# Patient Record
Sex: Female | Born: 2000
Health system: Southern US, Community
[De-identification: ages and names within clinical notes are randomized; demographics above are authoritative.]

## PROBLEM LIST (undated history)

## (undated) DIAGNOSIS — F419 Anxiety disorder, unspecified: Secondary | ICD-10-CM

## (undated) DIAGNOSIS — T7840XA Allergy, unspecified, initial encounter: Secondary | ICD-10-CM

## (undated) HISTORY — DX: Anxiety disorder, unspecified: F41.9

## (undated) HISTORY — DX: Allergy, unspecified, initial encounter: T78.40XA

---
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
## 2020-04-10 ENCOUNTER — Other Ambulatory Visit: Payer: Self-pay | Admitting: Family Medicine

## 2020-04-10 DIAGNOSIS — F411 Generalized anxiety disorder: Secondary | ICD-10-CM

## 2020-04-10 NOTE — Telephone Encounter (Signed)
[name]. NTBS LOV 06/14/19 mail order not sent

## 2020-05-06 ENCOUNTER — Telehealth: Payer: Self-pay | Admitting: Family Medicine

## 2020-05-06 NOTE — Telephone Encounter (Signed)
Yes I am fine to go ahead and write a letter saying that she can have an emotional support animal

## 2020-05-07 NOTE — Telephone Encounter (Signed)
Patient states that was all letter needed to say.  Letter has been completed and placed up front- patient aware.

## 2020-05-07 NOTE — Telephone Encounter (Signed)
LMTCB- letter has not been done yet.  What does letter need to have in it? Just that she needs a emotional support animal ??

## 2020-07-19 ENCOUNTER — Telehealth: Payer: Self-pay | Admitting: Family Medicine

## 2020-07-19 ENCOUNTER — Other Ambulatory Visit: Payer: Self-pay

## 2020-07-19 DIAGNOSIS — Z0000 Encounter for general adult medical examination without abnormal findings: Secondary | ICD-10-CM

## 2020-07-19 NOTE — Telephone Encounter (Signed)
Spoke with dad. Appt for televisit made for Tuesday at 3pm. Patient will stop by Monday to leave blood and to pick up letter for college.

## 2020-07-19 NOTE — Telephone Encounter (Signed)
Pts dad called stating that pt just informed him that she was almost out of her Prozac Rx and she is getting ready to leave for college on 07/23/20. Wants to know if there is anyway Dr [name] can work her in on Monday 07/22/20 or if another provider can see her or if pt can be scheduled as a televisit and just come in before she leaves for college to give urine sample/blood work if needed.

## 2020-07-23 ENCOUNTER — Telehealth: Payer: Self-pay | Admitting: Family Medicine

## 2020-07-23 ENCOUNTER — Encounter: Payer: Self-pay | Admitting: Family Medicine

## 2020-07-23 ENCOUNTER — Encounter: Payer: Commercial Managed Care - PPO | Admitting: Family Medicine

## 2020-07-23 NOTE — Progress Notes (Signed)
Unable to reach patient after multiple attempts

## 2020-07-23 NOTE — Telephone Encounter (Signed)
It looks like there was a phone message from May of this year that said from [place] that the note was completed and placed upfront then, I do not know for sure about that but you could always reprint it, thanks

## 2020-07-24 ENCOUNTER — Ambulatory Visit: Payer: Commercial Managed Care - PPO | Admitting: Family Medicine

## 2020-07-24 NOTE — Telephone Encounter (Signed)
Spoke with patient, she said she has already picked up the letter.

## 2021-05-15 HISTORY — PX: IR OUTSIDE FILMS BODY: IMG5291

## 2021-06-25 ENCOUNTER — Other Ambulatory Visit (HOSPITAL_COMMUNITY): Payer: Self-pay | Admitting: Physician Assistant

## 2021-06-25 DIAGNOSIS — R1084 Generalized abdominal pain: Secondary | ICD-10-CM

## 2021-06-25 DIAGNOSIS — R112 Nausea with vomiting, unspecified: Secondary | ICD-10-CM

## 2021-06-27 ENCOUNTER — Encounter (HOSPITAL_COMMUNITY): Payer: Self-pay

## 2021-06-27 ENCOUNTER — Encounter (HOSPITAL_COMMUNITY)
Admission: RE | Admit: 2021-06-27 | Discharge: 2021-06-27 | Disposition: A | Discharge status: Home / Self Care | Payer: Commercial Managed Care - PPO | Source: Ambulatory Visit | Attending: Physician Assistant | Admitting: Physician Assistant

## 2021-06-27 ENCOUNTER — Other Ambulatory Visit: Payer: Self-pay

## 2021-06-27 DIAGNOSIS — R112 Nausea with vomiting, unspecified: Secondary | ICD-10-CM | POA: Diagnosis present

## 2021-06-27 DIAGNOSIS — R1084 Generalized abdominal pain: Secondary | ICD-10-CM | POA: Diagnosis not present

## 2021-06-27 MED ORDER — TECHNETIUM TC 99M MEBROFENIN IV KIT
5.0000 | PACK | Freq: Once | INTRAVENOUS | Status: AC | PRN
  Administered 2021-06-27: 5.5 via INTRAVENOUS

## 2021-06-27 MED ORDER — SINCALIDE 5 MCG IJ SOLR
INTRAMUSCULAR | Status: AC
  Administered 2021-06-27: 1.11 ug via INTRAVENOUS
  Filled 2021-06-27: qty 5

## 2021-06-27 MED ORDER — SODIUM CHLORIDE FLUSH 0.9 % IV SOLN
INTRAVENOUS | Status: AC
  Filled 2021-06-27: qty 20

## 2021-06-27 MED ORDER — STERILE WATER FOR INJECTION IJ SOLN
INTRAMUSCULAR | Status: AC
  Administered 2021-06-27: 1.11 mL via INTRAVENOUS
  Filled 2021-06-27: qty 10

## 2021-07-04 ENCOUNTER — Telehealth: Payer: Self-pay | Admitting: Family Medicine

## 2021-07-04 NOTE — Telephone Encounter (Signed)
A letter has been created and placed up front for pt.  Per [name], please have pt schedule an appointment for a check up on her health. It can be a next available appointment.

## 2021-07-04 NOTE — Telephone Encounter (Signed)
Pt needs an updated letter to have pet for emotional support.  Updated date for this year and needs to have [name]'s signature. This is for apartment. Please call back when ready

## 2021-07-04 NOTE — Telephone Encounter (Signed)
Patient has not been seen in almost 2 years, I am okay with doing the letter for her but she needs to get an appointment on file so she can come in for a physical or at least an evaluation for her emotional health.

## 2021-07-04 NOTE — Telephone Encounter (Signed)
Okay for letter

## 2021-07-08 ENCOUNTER — Encounter: Payer: Self-pay | Admitting: Family Medicine

## 2021-07-08 ENCOUNTER — Other Ambulatory Visit: Payer: Self-pay

## 2021-07-08 ENCOUNTER — Ambulatory Visit (INDEPENDENT_AMBULATORY_CARE_PROVIDER_SITE_OTHER): Payer: Commercial Managed Care - PPO | Admitting: Family Medicine

## 2021-07-08 VITALS — BP 113/81 | HR 98 | Ht 62.0 in | Wt 119.0 lb

## 2021-07-08 DIAGNOSIS — R197 Diarrhea, unspecified: Secondary | ICD-10-CM

## 2021-07-08 DIAGNOSIS — F419 Anxiety disorder, unspecified: Secondary | ICD-10-CM

## 2021-07-08 DIAGNOSIS — R634 Abnormal weight loss: Secondary | ICD-10-CM

## 2021-07-08 DIAGNOSIS — K5909 Other constipation: Secondary | ICD-10-CM | POA: Diagnosis not present

## 2021-07-08 DIAGNOSIS — R1032 Left lower quadrant pain: Secondary | ICD-10-CM

## 2021-07-08 MED ORDER — AMITRIPTYLINE HCL 50 MG PO TABS: 50.0000 mg | ORAL_TABLET | Freq: Every day | ORAL | 1 refills | Status: DC

## 2021-07-08 NOTE — Progress Notes (Signed)
BP 113/81   Pulse 98   Ht '5\' 2"'  (1.575 m)   Wt 119 lb (54 kg)   SpO2 97%   BMI 21.77 kg/m    Subjective:   Patient ID: Julie Vasquez, female    DOB: [birth year], 20 y.o.   MRN: [id number]  HPI: Raynie Vasquez is a 20 y.o. female presenting on 07/08/2021 for Abdominal Pain (LLQ- started in April), Nausea, and Emesis   HPI Patient has been fighting gradually worsening abdominal pain and nausea that is been going on over the past 3 or 4 months.  She says it started up at school and she is started having some nausea and vomiting intermittently and then lower abdominal pain and she did go see a gastroenterologist up there and had some scans and some images and then she has also had a HIDA scan here.  She had a CT abdomen and an EGD and is now on Dexilant and has also tried famotidine and omeprazole and sucralfate and Zofran and dicyclomine.  She has gotten to where over the past couple months she has lost about 10 pounds of weight because she is not keeping her food down.  She denies any blood in her stool or blood in her vomit.  She does complain of some pain with intercourse but that is not the same pain that she experiences.  The left lower quadrant abdominal pain is constant but when she does eat it spreads to her whole abdomen.  She did have celiac testing and also did a trial of going off milk gluten and eggs for 3 weeks and it made no difference.  She does feel like it is worsening to where she cannot even function because of it now.  Relevant past medical, surgical, family and social history reviewed and updated as indicated. Interim medical history since our last visit reviewed. Allergies and medications reviewed and updated.  Review of Systems  Constitutional:  Negative for chills and fever.  Eyes:  Negative for visual disturbance.  Respiratory:  Negative for chest tightness and shortness of breath.   Cardiovascular:  Negative for chest pain and leg swelling.  Gastrointestinal:  Positive for  abdominal pain, constipation, diarrhea, nausea and vomiting.  Musculoskeletal:  Negative for back pain and gait problem.  Skin:  Negative for rash.  Neurological:  Negative for light-headedness and headaches.  Psychiatric/Behavioral:  Negative for agitation and behavioral problems.   All other systems reviewed and are negative.  Per HPI unless specifically indicated above   Allergies as of 07/08/2021       Reactions   Other Itching   Peanuts        Medication List        Accurate as of July 08, 2021 11:27 AM. If you have any questions, ask your nurse or doctor.          STOP taking these medications    FLUoxetine 40 MG capsule Commonly known as: PROZAC Stopped by: Worthy Rancher, MD   ibuprofen 600 MG tablet Commonly known as: ADVIL Stopped by: Fransisca Kaufmann Kirstie Larsen, MD   montelukast 10 MG tablet Commonly known as: SINGULAIR Stopped by: Fransisca Kaufmann Tyreque Finken, MD       TAKE these medications    amitriptyline 50 MG tablet Commonly known as: ELAVIL Take 1 tablet (50 mg total) by mouth at bedtime. Started by: Fransisca Kaufmann Shavona Gunderman, MD   azelastine 0.1 % nasal spray Commonly known as: ASTELIN Place 2 sprays into both nostrils 2 (two)  times daily. Use in each nostril as directed   cetirizine 10 MG tablet Commonly known as: ZYRTEC Take 10 mg by mouth daily.   norethindrone-ethinyl estradiol-iron 1.5-30 MG-MCG tablet Commonly known as: LOESTRIN FE Take 1 tablet by mouth daily.         Objective:   BP 113/81   Pulse 98   Ht '5\' 2"'  (1.575 m)   Wt 119 lb (54 kg)   SpO2 97%   BMI 21.77 kg/m   Wt Readings from Last 3 Encounters:  07/08/21 119 lb (54 kg)  06/14/19 111 lb 6.4 oz (50.5 kg) (22 %, Z= -0.78)*  10/14/17 93 lb 12.8 oz (42.5 kg) (2 %, Z= -1.97)*   * Growth percentiles are based on CDC (Girls, 2-20 Years) data.    Physical Exam Vitals and nursing note reviewed.  Constitutional:      General: She is not in acute distress.    Appearance: She  is well-developed. She is not diaphoretic.  Eyes:     Conjunctiva/sclera: Conjunctivae normal.  Cardiovascular:     Rate and Rhythm: Normal rate and regular rhythm.     Heart sounds: Normal heart sounds. No murmur heard. Pulmonary:     Effort: Pulmonary effort is normal. No respiratory distress.     Breath sounds: Normal breath sounds. No wheezing.  Abdominal:     General: Abdomen is flat. Bowel sounds are normal. There is no distension.     Tenderness: There is abdominal tenderness. There is no right CVA tenderness, left CVA tenderness, guarding or rebound.  Skin:    General: Skin is warm and dry.     Findings: No rash.  Neurological:     Mental Status: She is alert and oriented to person, place, and time.     Coordination: Coordination normal.  Psychiatric:        Behavior: Behavior normal.      Assessment & Plan:   Problem List Items Addressed This Visit   None Visit Diagnoses     Left lower quadrant abdominal pain    -  Primary   Relevant Medications   amitriptyline (ELAVIL) 50 MG tablet   Other Relevant Orders   Food Allergy Profile   Alpha-Gal Panel   BMP8+EGFR   Cdiff NAA+O+P+Stool Culture   Ambulatory referral to Gastroenterology   Intermittent diarrhea       Relevant Medications   amitriptyline (ELAVIL) 50 MG tablet   Other Relevant Orders   Food Allergy Profile   Alpha-Gal Panel   BMP8+EGFR   Cdiff NAA+O+P+Stool Culture   Ambulatory referral to Gastroenterology   Intermittent constipation       Relevant Medications   amitriptyline (ELAVIL) 50 MG tablet   Other Relevant Orders   Food Allergy Profile   Alpha-Gal Panel   BMP8+EGFR   Cdiff NAA+O+P+Stool Culture   Ambulatory referral to Gastroenterology   Recent unexplained weight loss       Relevant Medications   amitriptyline (ELAVIL) 50 MG tablet   Other Relevant Orders   Food Allergy Profile   Alpha-Gal Panel   BMP8+EGFR   Cdiff NAA+O+P+Stool Culture   Ambulatory referral to Gastroenterology    Anxiety       Relevant Medications   amitriptyline (ELAVIL) 50 MG tablet     Will try patient on amitriptyline, will have her do a stool study panel and some food allergy panels and will test for red meat allergy.    Follow up plan: Return if symptoms worsen or fail  to improve, for 2 to 4-week follow-up.  Counseling provided for all of the vaccine components Orders Placed This Encounter  Procedures   Cdiff NAA+O+P+Stool Culture   Food Allergy Profile   Alpha-Gal Panel   BMP8+EGFR   Ambulatory referral to Gastroenterology    Caryl Pina, MD [facility] 07/08/2021, 11:27 AM

## 2021-07-11 LAB — BMP8+EGFR
BUN/Creatinine Ratio: 12 (ref 9–23)
BUN: 9 mg/dL (ref 6–20)
CO2: 21 mmol/L (ref 20–29)
Calcium: 9.8 mg/dL (ref 8.7–10.2)
Chloride: 104 mmol/L (ref 96–106)
Creatinine, Ser: 0.77 mg/dL (ref 0.57–1.00)
Glucose: 83 mg/dL (ref 65–99)
Potassium: 4.1 mmol/L (ref 3.5–5.2)
Sodium: 139 mmol/L (ref 134–144)
eGFR: 113 mL/min/{1.73_m2} (ref >59)

## 2021-07-11 LAB — FOOD ALLERGY PROFILE
Allergen Corn, IgE: 0.25 kU/L — AB
Clam IgE: 1.01 kU/L — AB
Codfish IgE: <0.1 kU/L
Egg White IgE: 0.54 kU/L — AB
Milk IgE: 2.82 kU/L — AB
Peanut IgE: 0.13 kU/L — AB
Scallop IgE: 0.47 kU/L — AB
Sesame Seed IgE: 0.18 kU/L — AB
Shrimp IgE: 1.5 kU/L — AB
Soybean IgE: <0.1 kU/L
Walnut IgE: <0.1 kU/L
Wheat IgE: 0.53 kU/L — AB

## 2021-07-11 LAB — ALPHA-GAL PANEL
Allergen Lamb IgE: 0.27 kU/L — AB
Beef IgE: 0.39 kU/L — AB
IgE (Immunoglobulin E), Serum: 1161 IU/mL — ABNORMAL HIGH (ref 6–495)
O215-IgE Alpha-Gal: <0.1 kU/L
Pork IgE: 0.17 kU/L — AB

## 2021-07-24 ENCOUNTER — Other Ambulatory Visit: Payer: Self-pay

## 2021-07-24 DIAGNOSIS — R197 Diarrhea, unspecified: Secondary | ICD-10-CM

## 2021-07-24 DIAGNOSIS — K5909 Other constipation: Secondary | ICD-10-CM

## 2021-07-24 DIAGNOSIS — R634 Abnormal weight loss: Secondary | ICD-10-CM

## 2021-08-07 ENCOUNTER — Other Ambulatory Visit: Payer: Self-pay

## 2021-08-07 ENCOUNTER — Encounter: Payer: Self-pay | Admitting: Family Medicine

## 2021-08-07 ENCOUNTER — Ambulatory Visit (INDEPENDENT_AMBULATORY_CARE_PROVIDER_SITE_OTHER): Payer: Commercial Managed Care - PPO | Admitting: Family Medicine

## 2021-08-07 VITALS — BP 111/84 | HR 104 | Ht 62.0 in | Wt 118.0 lb

## 2021-08-07 DIAGNOSIS — K5909 Other constipation: Secondary | ICD-10-CM

## 2021-08-07 DIAGNOSIS — R197 Diarrhea, unspecified: Secondary | ICD-10-CM | POA: Diagnosis not present

## 2021-08-07 DIAGNOSIS — R1032 Left lower quadrant pain: Secondary | ICD-10-CM | POA: Diagnosis not present

## 2021-08-07 DIAGNOSIS — F411 Generalized anxiety disorder: Secondary | ICD-10-CM

## 2021-08-07 DIAGNOSIS — R634 Abnormal weight loss: Secondary | ICD-10-CM | POA: Diagnosis not present

## 2021-08-07 MED ORDER — HYDROXYZINE PAMOATE 25 MG PO CAPS: 25.0000 mg | ORAL_CAPSULE | Freq: Three times a day (TID) | ORAL | 1 refills | Status: DC | PRN

## 2021-08-07 MED ORDER — FLUOXETINE HCL 20 MG PO CAPS: 20.0000 mg | ORAL_CAPSULE | Freq: Every day | ORAL | 3 refills | Status: DC

## 2021-08-07 NOTE — Progress Notes (Signed)
BP 111/84   Pulse (!) 104   Ht 5\' 2"  (1.575 m)   Wt 118 lb (53.5 kg)   SpO2 96%   BMI 21.58 kg/m    Subjective:   Patient ID: , female    DOB: 2001/09/01, 20 y.o.   MRN: [birth year]  HPI: Julie Vasquez is a 20 y.o. female presenting on 08/07/2021 for Abdominal Pain (LLQ)   HPI Anxiety Patient is coming in today for anxiety recheck.  She has been following her health issues she started to have panic attacks again and feels like her anxiety is building up and wants to try something to help with that.  She did have Prozac previously and wants to try that.  We discussed giving hydroxyzine as well.  She denies any suicidal ideations or thoughts of hurting self. Depression screen [facility] 2/9 08/07/2021 08/07/2021 07/08/2021 06/14/2019 09/03/2017  Decreased Interest - 2 2 0 2  Down, Depressed, Hopeless 2 1 1  0 0  PHQ - 2 Score 2 3 3  0 2  Altered sleeping 3 1 1  - 3  Tired, decreased energy 3 3 3  - 1  Change in appetite 2 3 3  - 2  Feeling bad or failure about yourself  1 0 0 - 0  Trouble concentrating - 2 2 - 1  Moving slowly or fidgety/restless - 0 0 - 0  Suicidal thoughts 1 0 0 - 0  PHQ-9 Score 12 12 12  - 9  Difficult doing work/chores - - - - Somewhat difficult    Patient is coming in for follow-up on abdominal pain and she is doing a lot better.  We did do a referral to an allergist because allergy testing, specifically the known allergen was high and some of the others as well.  She has not gotten a call about the appointment we will try and track that down for her.  She did have her gallbladder removal, seems to be doing better.  Relevant past medical, surgical, family and social history reviewed and updated as indicated. Interim medical history since our last visit reviewed. Allergies and medications reviewed and updated.  Review of Systems  Constitutional:  Negative for chills and fever.  Eyes:  Negative for visual disturbance.  Respiratory:  Negative for chest tightness and  shortness of breath.   Cardiovascular:  Negative for chest pain and leg swelling.  Gastrointestinal:  Positive for abdominal pain, constipation and diarrhea. Negative for nausea and vomiting.  Skin:  Negative for rash.  Neurological:  Negative for light-headedness and headaches.  Psychiatric/Behavioral:  Positive for dysphoric mood and sleep disturbance. Negative for agitation, behavioral problems, self-injury and suicidal ideas. The patient is nervous/anxious.   All other systems reviewed and are negative.  Per HPI unless specifically indicated above   Allergies as of 08/07/2021       Reactions   Other Itching   Peanuts        Medication List        Accurate as of August 07, 2021  9:38 AM. If you have any questions, ask your nurse or doctor.          STOP taking these medications    amitriptyline 50 MG tablet Commonly known as: ELAVIL Stopped by: Harol Shabazz, MD   azelastine 0.1 % nasal spray Commonly known as: ASTELIN Stopped by: Ledger Heindl, MD       TAKE these medications    cetirizine 10 MG tablet Commonly known as: ZYRTEC Take 10 mg by  mouth daily.   dexlansoprazole 60 MG capsule Commonly known as: DEXILANT Take 1 capsule by mouth daily as needed.   FLUoxetine 20 MG capsule Commonly known as: PROZAC Take 1 capsule (20 mg total) by mouth daily. Started by: Nils Pyle, MD   hydrOXYzine 25 MG capsule Commonly known as: VISTARIL Take 1 capsule (25 mg total) by mouth every 8 (eight) hours as needed. Started by: Nils Pyle, MD   norethindrone-ethinyl estradiol-iron 1.5-30 MG-MCG tablet Commonly known as: LOESTRIN FE Take 1 tablet by mouth daily.         Objective:   BP 111/84   Pulse (!) 104   Ht 5\' 2"  (1.575 m)   Wt 118 lb (53.5 kg)   SpO2 96%   BMI 21.58 kg/m   Wt Readings from Last 3 Encounters:  08/07/21 118 lb (53.5 kg)  07/08/21 119 lb (54 kg)  06/14/19 111 lb 6.4 oz (50.5 kg) (22 %, Z= -0.78)*    * Growth percentiles are based on CDC (Girls, 2-20 Years) data.    Physical Exam Vitals and nursing note reviewed.  Constitutional:      General: She is not in acute distress.    Appearance: She is well-developed. She is not diaphoretic.  Eyes:     Conjunctiva/sclera: Conjunctivae normal.  Abdominal:     General: Abdomen is flat. Bowel sounds are normal. There is no distension.     Tenderness: There is no abdominal tenderness. There is no guarding or rebound.     Hernia: No hernia is present.  Musculoskeletal:        General: No tenderness.  Skin:    General: Skin is warm and dry.     Findings: No rash.  Neurological:     Mental Status: She is alert and oriented to person, place, and time.     Coordination: Coordination normal.  Psychiatric:        Behavior: Behavior normal.      Assessment & Plan:   Problem List Items Addressed This Visit       Other   Generalized anxiety disorder   Relevant Medications   FLUoxetine (PROZAC) 20 MG capsule   hydrOXYzine (VISTARIL) 25 MG capsule   Other Visit Diagnoses     Recent unexplained weight loss    -  Primary   Intermittent diarrhea       Left lower quadrant abdominal pain       Intermittent constipation           Restart the Prozac and also give the Vistaril.  Seems like weight and eating is doing better after cholecystectomy, will continue to monitor, she thinks a possibility is birth control issues and if she does want to change with that, we gave some other options such as Mirena or NuvaRing as a possibility. Follow up plan: Return in about 4 weeks (around 09/04/2021), or if symptoms worsen or fail to improve, for Anxiety recheck.  Counseling provided for all of the vaccine components No orders of the defined types were placed in this encounter.   09/06/2021, MD [facility] 08/07/2021, 9:38 AM

## 2021-08-28 ENCOUNTER — Encounter: Payer: Self-pay | Admitting: Family Medicine

## 2021-08-28 ENCOUNTER — Ambulatory Visit (INDEPENDENT_AMBULATORY_CARE_PROVIDER_SITE_OTHER): Payer: 59 | Admitting: Family Medicine

## 2021-08-28 DIAGNOSIS — F411 Generalized anxiety disorder: Secondary | ICD-10-CM

## 2021-08-28 NOTE — Progress Notes (Signed)
Virtual Visit via telephone Note  I connected with Julie Vasquez on 08/28/21 at 1101 by telephone and verified that I am speaking with the correct person using two identifiers. Julie Vasquez is currently located at school and patient are currently with her during visit. The provider, Elige Radon Abubakr Wieman, MD is located in their office at time of visit.  Call ended at 1109  I discussed the limitations, risks, security and privacy concerns of performing an evaluation and management service by telephone and the availability of in person appointments. I also discussed with the patient that there may be a patient responsible charge related to this service. The patient expressed understanding and agreed to proceed.   History and Present Illness: Anxiety  She is feeling like less panic attacks and anxiety is better.  She is taking prozac and hydroxizine. She denies side effects.  She is feeling like school is going well and likes the medicine  1. Generalized anxiety disorder     Outpatient Encounter Medications as of 08/28/2021  Medication Sig   cetirizine (ZYRTEC) 10 MG tablet Take 10 mg by mouth daily.   dexlansoprazole (DEXILANT) 60 MG capsule Take 1 capsule by mouth daily as needed.   FLUoxetine (PROZAC) 20 MG capsule Take 1 capsule (20 mg total) by mouth daily.   hydrOXYzine (VISTARIL) 25 MG capsule Take 1 capsule (25 mg total) by mouth every 8 (eight) hours as needed.   norethindrone-ethinyl estradiol-iron (LOESTRIN FE) 1.5-30 MG-MCG tablet Take 1 tablet by mouth daily.   No facility-administered encounter medications on file as of 08/28/2021.    Review of Systems  Constitutional:  Negative for chills and fever.  Eyes:  Negative for visual disturbance.  Respiratory:  Negative for chest tightness and shortness of breath.   Cardiovascular:  Negative for chest pain and leg swelling.  Skin:  Negative for rash.  Neurological:  Negative for dizziness, light-headedness and headaches.   Psychiatric/Behavioral:  Positive for dysphoric mood. Negative for agitation, behavioral problems, self-injury, sleep disturbance and suicidal ideas. The patient is nervous/anxious.   All other systems reviewed and are negative.  Observations/Objective: Patient sounds comfortable and in no acute distress  Assessment and Plan: Problem List Items Addressed This Visit       Other   Generalized anxiety disorder    Feels like she is doing pretty well and has not had a panic attack in the past 3 weeks and denies side effects medicine so we will continue forward with that.  She is still having some stomach issues and is working on getting back into her gastroenterologist and still working on getting into see an Proofreader as well. Follow up plan: Return if symptoms worsen or fail to improve, for 2-3 months anxiety.     I discussed the assessment and treatment plan with the patient. The patient was provided an opportunity to ask questions and all were answered. The patient agreed with the plan and demonstrated an understanding of the instructions.   The patient was advised to call back or seek an in-person evaluation if the symptoms worsen or if the condition fails to improve as anticipated.  The above assessment and management plan was discussed with the patient. The patient verbalized understanding of and has agreed to the management plan. Patient is aware to call the clinic if symptoms persist or worsen. Patient is aware when to return to the clinic for a follow-up visit. Patient educated on when it is appropriate to go to the emergency department.  I provided 8 minutes of non-face-to-face time during this encounter.    Nils Pyle, MD

## 2021-09-16 ENCOUNTER — Telehealth: Payer: Self-pay | Admitting: Family Medicine

## 2021-09-16 ENCOUNTER — Other Ambulatory Visit: Payer: Self-pay | Admitting: Family Medicine

## 2021-09-16 DIAGNOSIS — T781XXA Other adverse food reactions, not elsewhere classified, initial encounter: Secondary | ICD-10-CM

## 2021-09-16 MED ORDER — EPINEPHRINE 0.3 MG/0.3ML IJ SOAJ: 0.3000 mg | Freq: Once | INTRAMUSCULAR | Status: DC

## 2021-09-16 MED ORDER — EPINEPHRINE 0.3 MG/0.3ML IJ SOAJ: 0.3000 mg | INTRAMUSCULAR | 1 refills | Status: AC | PRN

## 2021-09-16 NOTE — Telephone Encounter (Signed)
Patient called in stating she is having sever reactions from food has alpha gal wants epi pen called in per [facility] verbal okay to send in.

## 2021-11-20 ENCOUNTER — Ambulatory Visit: Payer: 59 | Admitting: Family Medicine

## 2021-11-21 ENCOUNTER — Encounter: Payer: Self-pay | Admitting: Family Medicine

## 2021-11-21 ENCOUNTER — Ambulatory Visit (INDEPENDENT_AMBULATORY_CARE_PROVIDER_SITE_OTHER): Payer: 59 | Admitting: Family Medicine

## 2021-11-21 VITALS — BP 119/83 | HR 82 | Ht 62.0 in | Wt 122.0 lb

## 2021-11-21 DIAGNOSIS — F411 Generalized anxiety disorder: Secondary | ICD-10-CM | POA: Diagnosis not present

## 2021-11-21 DIAGNOSIS — R350 Frequency of micturition: Secondary | ICD-10-CM | POA: Diagnosis not present

## 2021-11-21 DIAGNOSIS — R1110 Vomiting, unspecified: Secondary | ICD-10-CM | POA: Diagnosis not present

## 2021-11-21 LAB — URINALYSIS, COMPLETE
Bilirubin, UA: NEGATIVE
Glucose, UA: NEGATIVE
Ketones, UA: NEGATIVE
Leukocytes,UA: NEGATIVE
Nitrite, UA: NEGATIVE
Protein,UA: NEGATIVE
RBC, UA: NEGATIVE
Specific Gravity, UA: 1.02 (ref 1.005–1.030)
Urobilinogen, Ur: 2 mg/dL — ABNORMAL HIGH (ref 0.2–1.0)
pH, UA: 8 — ABNORMAL HIGH (ref 5.0–7.5)

## 2021-11-21 LAB — MICROSCOPIC EXAMINATION
Bacteria, UA: NONE SEEN
RBC, Urine: NONE SEEN /hpf (ref 0–2)
Renal Epithel, UA: NONE SEEN /hpf
WBC, UA: NONE SEEN /hpf (ref 0–5)

## 2021-11-21 NOTE — Progress Notes (Signed)
BP 119/83   Pulse 82   Ht 5\' 2"  (1.575 m)   Wt 122 lb (55.3 kg)   SpO2 99%   BMI 22.31 kg/m    Subjective:   Patient ID: Julie Vasquez, female    DOB: [birth year], 20 y.o.   MRN: [id number]  HPI: Julie Vasquez is a 20 y.o. female presenting on 11/21/2021 for Medical Management of Chronic Issues and Anxiety   HPI Anxiety and stomach issues Patient is coming in today for recheck of anxiety stomach issues.  She still having some intermittent vomiting and some intermittent diarrhea although it is 1-2 times per week which is improved from previous.  She says her anxiety is under control except for she is getting close to test time but she feels like the medication is helping her and she feels happier.  She is engaged and still very happy with her fianc and they have a good relationship.  She is still taking her birth control and feels like it is doing well for her.  She denies any suicidal ideations or thoughts of hurting self.  She has another appointment with gastroenterology in [facility] in January. Depression screen [facility] 2/9 08/07/2021 08/07/2021 07/08/2021 06/14/2019 09/03/2017  Decreased Interest - 2 2 0 2  Down, Depressed, Hopeless 2 1 1  0 0  PHQ - 2 Score 2 3 3  0 2  Altered sleeping 3 1 1  - 3  Tired, decreased energy 3 3 3  - 1  Change in appetite 2 3 3  - 2  Feeling bad or failure about yourself  1 0 0 - 0  Trouble concentrating - 2 2 - 1  Moving slowly or fidgety/restless - 0 0 - 0  Suicidal thoughts 1 0 0 - 0  PHQ-9 Score 12 12 12  - 9  Difficult doing work/chores - - - - Somewhat difficult    Patient complains of week and a half of dysuria and frequency.  She did try some Azo but it did not seem to help and just made her feel sick to her stomach.  She still has the frequency.  She denies any major burning but just feels like she has to go all the time and that comes in small amounts and is not necessarily a lot when it comes.  Relevant past medical, surgical, family and social  history reviewed and updated as indicated. Interim medical history since our last visit reviewed. Allergies and medications reviewed and updated.  Review of Systems  Constitutional:  Negative for chills and fever.  Eyes:  Negative for visual disturbance.  Respiratory:  Negative for chest tightness and shortness of breath.   Cardiovascular:  Negative for chest pain and leg swelling.  Gastrointestinal:  Positive for abdominal pain, nausea and vomiting.  Skin:  Negative for rash.  Neurological:  Negative for light-headedness and headaches.  Psychiatric/Behavioral:  Negative for agitation, behavioral problems, dysphoric mood, self-injury, sleep disturbance and suicidal ideas. The patient is nervous/anxious.   All other systems reviewed and are negative.  Per HPI unless specifically indicated above   Allergies as of 11/21/2021       Reactions   Other Itching   Peanuts        Medication List        Accurate as of November 21, 2021  1:37 PM. If you have any questions, ask your nurse or doctor.          STOP taking these medications    dexlansoprazole 60 MG capsule Commonly known  as: DEXILANT Stopped by: Nils Pyle, MD       TAKE these medications    cetirizine 10 MG tablet Commonly known as: ZYRTEC Take 10 mg by mouth daily.   EPINEPHrine 0.3 mg/0.3 mL Soaj injection Commonly known as: EPI-PEN Inject 0.3 mg into the muscle as needed for anaphylaxis.   esomeprazole 40 MG capsule Commonly known as: NEXIUM Take 1 capsule by mouth 2 (two) times daily.   FLUoxetine 20 MG capsule Commonly known as: PROZAC Take 1 capsule (20 mg total) by mouth daily.   hydrOXYzine 25 MG capsule Commonly known as: VISTARIL Take 1 capsule (25 mg total) by mouth every 8 (eight) hours as needed.   norethindrone-ethinyl estradiol-iron 1.5-30 MG-MCG tablet Commonly known as: LOESTRIN FE Take 1 tablet by mouth daily.         Objective:   BP 119/83   Pulse 82   Ht 5\' 2"   (1.575 m)   Wt 122 lb (55.3 kg)   SpO2 99%   BMI 22.31 kg/m   Wt Readings from Last 3 Encounters:  11/21/21 122 lb (55.3 kg)  08/07/21 118 lb (53.5 kg)  07/08/21 119 lb (54 kg)    Physical Exam Vitals and nursing note reviewed.  Constitutional:      General: She is not in acute distress.    Appearance: She is well-developed. She is not diaphoretic.  Eyes:     Conjunctiva/sclera: Conjunctivae normal.  Cardiovascular:     Rate and Rhythm: Normal rate and regular rhythm.     Heart sounds: Normal heart sounds. No murmur heard. Pulmonary:     Effort: Pulmonary effort is normal. No respiratory distress.     Breath sounds: Normal breath sounds. No wheezing.  Abdominal:     General: Abdomen is flat. Bowel sounds are normal. There is no distension.     Tenderness: There is abdominal tenderness in the right lower quadrant, epigastric area, suprapubic area and left lower quadrant. There is no right CVA tenderness, left CVA tenderness, guarding or rebound.  Musculoskeletal:        General: No tenderness. Normal range of motion.  Skin:    General: Skin is warm and dry.     Findings: No rash.  Neurological:     Mental Status: She is alert and oriented to person, place, and time.     Coordination: Coordination normal.  Psychiatric:        Behavior: Behavior normal.    Urinalysis pending because lab tech is at lunch.  Assessment & Plan:   Problem List Items Addressed This Visit       Other   Generalized anxiety disorder   Other Visit Diagnoses     Frequency of urination    -  Primary   Relevant Orders   Urinalysis, Complete   Intermittent vomiting           Continue Prozac, will await the urine results.  Stable she is GI to help with her stomach issues Follow up plan: Return for 3 to 71-month anxiety follow-up.  Counseling provided for all of the vaccine components Orders Placed This Encounter  Procedures   Urinalysis, Complete    8-month, MD [facility]  [facility] 11/21/2021, 1:37 PM

## 2022-02-19 ENCOUNTER — Ambulatory Visit: Payer: 59 | Admitting: Family Medicine

## 2022-03-05 ENCOUNTER — Ambulatory Visit (INDEPENDENT_AMBULATORY_CARE_PROVIDER_SITE_OTHER): Payer: 59 | Admitting: Family Medicine

## 2022-03-05 ENCOUNTER — Encounter: Payer: Self-pay | Admitting: Family Medicine

## 2022-03-05 VITALS — BP 105/75 | HR 80 | Ht 62.0 in | Wt 125.0 lb

## 2022-03-05 DIAGNOSIS — R1110 Vomiting, unspecified: Secondary | ICD-10-CM | POA: Diagnosis not present

## 2022-03-05 DIAGNOSIS — K3184 Gastroparesis: Secondary | ICD-10-CM | POA: Diagnosis not present

## 2022-03-05 DIAGNOSIS — F411 Generalized anxiety disorder: Secondary | ICD-10-CM | POA: Diagnosis not present

## 2022-03-05 DIAGNOSIS — E039 Hypothyroidism, unspecified: Secondary | ICD-10-CM

## 2022-03-05 MED ORDER — HYDROXYZINE PAMOATE 25 MG PO CAPS: 25.0000 mg | ORAL_CAPSULE | Freq: Three times a day (TID) | ORAL | 1 refills | Status: DC | PRN

## 2022-03-05 NOTE — Progress Notes (Signed)
? ?BP 105/75   Pulse 80   Ht '5\' 2"'  (1.575 m)   Wt 125 lb (56.7 kg)   SpO2 95%   BMI 22.86 kg/m?   ? ?Subjective:  ? ?Patient ID: Julie Vasquez, female    DOB: January 25, 2001, 21 y.o.   MRN: [id number] ? ?HPI: ?Julie Vasquez is a 21 y.o. female presenting on 03/05/2022 for Medical Management of Chronic Issues and Anxiety ? ? ?HPI ?Anxiety recheck ?Patient is coming in for anxiety recheck.  She currently takes Prozac pretty well.  She did not have anxiety or panic related to her stomach issues and looking at her future because she has missed a lot of school due to her stomach issues.  They are hoping scopolamine will make a difference for her.  Does see GI and saw an allergist the allergist did not visually find any major issues.  She is still on Nexium to help with heartburn.  Does still take birth control to control her cycles. ?Depression screen [facility] 2/9 03/05/2022 08/07/2021 08/07/2021 07/08/2021 06/14/2019  ?Decreased Interest 2 - 2 2 0  ?Down, Depressed, Hopeless '3 2 1 1 ' 0  ?PHQ - 2 Score '5 2 3 3 ' 0  ?Altered sleeping '1 3 1 1 ' -  ?Tired, decreased energy '3 3 3 3 ' -  ?Change in appetite '2 2 3 3 ' -  ?Feeling bad or failure about yourself  2 1 0 0 -  ?Trouble concentrating 1 - 2 2 -  ?Moving slowly or fidgety/restless 0 - 0 0 -  ?Suicidal thoughts 0 1 0 0 -  ?PHQ-9 Score '14 12 12 12 ' -  ?Difficult doing work/chores - - - - -  ?  ? ?Relevant past medical, surgical, family and social history reviewed and updated as indicated. Interim medical history since our last visit reviewed. ?Allergies and medications reviewed and updated. ? ?Review of Systems  ?Constitutional:  Negative for chills and fever.  ?Eyes:  Negative for visual disturbance.  ?Respiratory:  Negative for chest tightness and shortness of breath.   ?Cardiovascular:  Negative for chest pain and leg swelling.  ?Skin:  Negative for rash.  ?Neurological:  Negative for light-headedness and headaches.  ?Psychiatric/Behavioral:  Negative for agitation, behavioral problems,  dysphoric mood, self-injury, sleep disturbance and suicidal ideas. The patient is nervous/anxious.   ?All other systems reviewed and are negative. ? ?Per HPI unless specifically indicated above ? ? ?Allergies as of 03/05/2022   ? ?   Reactions  ? Other Itching  ? Peanuts  ? ?  ? ?  ?Medication List  ?  ? ?  ? Accurate as of March 05, 2022  9:07 AM. If you have any questions, ask your nurse or doctor.  ?  ?  ? ?  ? ?cetirizine 10 MG tablet ?Commonly known as: ZYRTEC ?Take 10 mg by mouth daily. ?  ?EPINEPHrine 0.3 mg/0.3 mL Soaj injection ?Commonly known as: EPI-PEN ?Inject 0.3 mg into the muscle as needed for anaphylaxis. ?  ?esomeprazole 40 MG capsule ?Commonly known as: Temple ?Take 1 capsule by mouth 2 (two) times daily. ?  ?FLUoxetine 20 MG capsule ?Commonly known as: PROZAC ?Take 1 capsule (20 mg total) by mouth daily. ?  ?hydrOXYzine 25 MG capsule ?Commonly known as: VISTARIL ?Take 1 capsule (25 mg total) by mouth every 8 (eight) hours as needed. ?  ?norethindrone-ethinyl estradiol-iron 1.5-30 MG-MCG tablet ?Commonly known as: LOESTRIN FE ?Take 1 tablet by mouth daily. ?  ?scopolamine 1 MG/3DAYS ?Commonly known as: TRANSDERM-SCOP ?  Place 1 patch onto the skin every 3 (three) days. ?  ? ?  ? ? ? ?Objective:  ? ?BP 105/75   Pulse 80   Ht '5\' 2"'  (1.575 m)   Wt 125 lb (56.7 kg)   SpO2 95%   BMI 22.86 kg/m?   ?Wt Readings from Last 3 Encounters:  ?03/05/22 125 lb (56.7 kg)  ?11/21/21 122 lb (55.3 kg)  ?08/07/21 118 lb (53.5 kg)  ?  ?Physical Exam ?Vitals and nursing note reviewed.  ?Constitutional:   ?   General: She is not in acute distress. ?   Appearance: She is well-developed. She is not diaphoretic.  ?Eyes:  ?   Conjunctiva/sclera: Conjunctivae normal.  ?Cardiovascular:  ?   Rate and Rhythm: Normal rate and regular rhythm.  ?   Heart sounds: Normal heart sounds. No murmur heard. ?Pulmonary:  ?   Effort: Pulmonary effort is normal. No respiratory distress.  ?   Breath sounds: Normal breath sounds. No wheezing.   ?Musculoskeletal:     ?   General: No tenderness. Normal range of motion.  ?Skin: ?   General: Skin is warm and dry.  ?   Findings: No rash.  ?Neurological:  ?   Mental Status: She is alert and oriented to person, place, and time.  ?   Coordination: Coordination normal.  ?Psychiatric:     ?   Behavior: Behavior normal.  ? ? ? ? ?Assessment & Plan:  ? ?Problem List Items Addressed This Visit   ? ?  ? Other  ? Generalized anxiety disorder - Primary  ? Relevant Medications  ? hydrOXYzine (VISTARIL) 25 MG capsule  ? ?Other Visit Diagnoses   ? ? Intermittent vomiting      ? Relevant Orders  ? CBC with Differential/Platelet  ? CMP14+EGFR  ? VITAMIN D 25 Hydroxy (Vit-D Deficiency, Fractures)  ? Vitamin B12  ? Folate  ? TSH  ? Gastroparesis      ? Relevant Orders  ? CBC with Differential/Platelet  ? CMP14+EGFR  ? VITAMIN D 25 Hydroxy (Vit-D Deficiency, Fractures)  ? Vitamin B12  ? Folate  ? TSH  ? ?  ?  ?Continue on Prozac ?Patient sees GI and is currently on scopolamine and just started it and seeing if it helps.  She was diagnosed with gastroparesis and they recommended Linzess but she was unable to get through to affordability. ?Follow up plan: ?Return in about 3 months (around 06/05/2022), or if symptoms worsen or fail to improve, for Pap smear and physical and recheck anxiety and depression. ? ?Counseling provided for all of the vaccine components ?Orders Placed This Encounter  ?Procedures  ? CBC with Differential/Platelet  ? CMP14+EGFR  ? VITAMIN D 25 Hydroxy (Vit-D Deficiency, Fractures)  ? Vitamin B12  ? Folate  ? TSH  ? ? ?Caryl Pina, MD ?[place] ?03/05/2022, 9:07 AM ? ? ? ? ?

## 2022-03-06 LAB — CMP14+EGFR
ALT: 46 IU/L — ABNORMAL HIGH (ref 0–32)
AST: 28 IU/L (ref 0–40)
Albumin/Globulin Ratio: 1.8 (ref 1.2–2.2)
Albumin: 4.5 g/dL (ref 3.9–5.0)
Alkaline Phosphatase: 87 IU/L (ref 44–121)
BUN/Creatinine Ratio: 8 — ABNORMAL LOW (ref 9–23)
BUN: 7 mg/dL (ref 6–20)
Bilirubin Total: 0.5 mg/dL (ref 0.0–1.2)
CO2: 22 mmol/L (ref 20–29)
Calcium: 9.9 mg/dL (ref 8.7–10.2)
Chloride: 103 mmol/L (ref 96–106)
Creatinine, Ser: 0.83 mg/dL (ref 0.57–1.00)
Globulin, Total: 2.5 g/dL (ref 1.5–4.5)
Glucose: 97 mg/dL (ref 70–99)
Potassium: 4.1 mmol/L (ref 3.5–5.2)
Sodium: 139 mmol/L (ref 134–144)
Total Protein: 7 g/dL (ref 6.0–8.5)
eGFR: 103 mL/min/{1.73_m2} (ref >59)

## 2022-03-06 LAB — CBC WITH DIFFERENTIAL/PLATELET
Basophils Absolute: 0.1 10*3/uL (ref 0.0–0.2)
Basos: 1 %
EOS (ABSOLUTE): 0.5 10*3/uL — ABNORMAL HIGH (ref 0.0–0.4)
Eos: 8 %
Hematocrit: 44.3 % (ref 34.0–46.6)
Hemoglobin: 15 g/dL (ref 11.1–15.9)
Immature Grans (Abs): 0 10*3/uL (ref 0.0–0.1)
Immature Granulocytes: 0 %
Lymphocytes Absolute: 2.4 10*3/uL (ref 0.7–3.1)
Lymphs: 36 %
MCH: 30.2 pg (ref 26.6–33.0)
MCHC: 33.9 g/dL (ref 31.5–35.7)
MCV: 89 fL (ref 79–97)
Monocytes Absolute: 0.4 10*3/uL (ref 0.1–0.9)
Monocytes: 6 %
Neutrophils Absolute: 3.2 10*3/uL (ref 1.4–7.0)
Neutrophils: 49 %
Platelets: 314 10*3/uL (ref 150–450)
RBC: 4.97 x10E6/uL (ref 3.77–5.28)
RDW: 12.3 % (ref 11.7–15.4)
WBC: 6.7 10*3/uL (ref 3.4–10.8)

## 2022-03-06 LAB — TSH: TSH: 4.58 u[IU]/mL — ABNORMAL HIGH (ref 0.450–4.500)

## 2022-03-06 LAB — VITAMIN D 25 HYDROXY (VIT D DEFICIENCY, FRACTURES): Vit D, 25-Hydroxy: 30 ng/mL (ref 30.0–100.0)

## 2022-03-06 LAB — VITAMIN B12: Vitamin B-12: 514 pg/mL (ref 232–1245)

## 2022-03-06 LAB — FOLATE: Folate: >20 ng/mL (ref >3.0)

## 2022-03-09 MED ORDER — LEVOTHYROXINE SODIUM 25 MCG PO TABS: 25.0000 ug | ORAL_TABLET | Freq: Every day | ORAL | 1 refills | Status: DC

## 2022-03-09 NOTE — Addendum Note (Signed)
Addended by: Dorene Sorrow on: 03/09/2022 04:39 PM ? ? Modules accepted: Orders ? ?

## 2022-03-10 ENCOUNTER — Telehealth: Payer: Self-pay | Admitting: Family Medicine

## 2022-03-10 NOTE — Telephone Encounter (Signed)
Pt has been informed to take the Levothyroxine once daily. She has an appt in 63m for follow up and will recheck thyroid at that time. ?

## 2022-03-10 NOTE — Telephone Encounter (Signed)
Patient needs clarification on how and when to take her levothyroxine (SYNTHROID) 25 MCG tablet. Please call back and advise.  ?

## 2022-03-11 ENCOUNTER — Telehealth: Payer: Self-pay | Admitting: Family Medicine

## 2022-03-11 NOTE — Telephone Encounter (Signed)
Left message for pt to return call.

## 2022-03-11 NOTE — Telephone Encounter (Signed)
Pt wants to know if her Hypothyroidism is related to her Gastroparesis.  ?

## 2022-03-11 NOTE — Telephone Encounter (Signed)
Pt called back with other questions. Wants to know if she should take a gel pill of her thyroid medicine since she has gastroparesis. ? ?Also wants to know if PCP can order for pt to have blood test done in [facility] and labs in [place] ([address] [place], [place] [address]) to have T3 and T4 checked and also check antibodies. ? ?Says mom is googling all of her issues and keeps coming up with all these issues and questions and is freaking out about it.  ?

## 2022-03-11 NOTE — Telephone Encounter (Signed)
Hypothyroidism can cause slowing of the gut but I have never heard of it causing full-blown gastroparesis but it is probably not helping the situation, I do not think the gastroparesis caused hypothyroidism though ?

## 2022-03-11 NOTE — Telephone Encounter (Signed)
Pt returned missed call. Reviewed providers notes with pt. Pt voiced understanding.  ?

## 2022-03-12 ENCOUNTER — Other Ambulatory Visit: Payer: Self-pay | Admitting: Family Medicine

## 2022-03-12 MED ORDER — LEVOTHYROXINE SODIUM 25 MCG PO CAPS: 25.0000 ug | ORAL_CAPSULE | Freq: Every day | ORAL | 3 refills | Status: DC

## 2022-03-12 NOTE — Progress Notes (Signed)
Pt made aware on vmail. Advised to call back with concerns. ?

## 2022-03-12 NOTE — Progress Notes (Signed)
Let her know that I did send in a gel capsule for her and she can try the or the tablet, I do not know if 1 or the other will do better for absorption than the other but she can try it ?

## 2022-03-13 NOTE — Telephone Encounter (Signed)
Please let her know to go ahead and do the tablets because it looks like the insurance company will not cover the capsules ?

## 2022-03-23 ENCOUNTER — Telehealth: Payer: Self-pay | Admitting: Family Medicine

## 2022-04-30 ENCOUNTER — Encounter: Payer: Self-pay | Admitting: Family

## 2022-04-30 ENCOUNTER — Ambulatory Visit (INDEPENDENT_AMBULATORY_CARE_PROVIDER_SITE_OTHER): Payer: 59 | Admitting: Family

## 2022-04-30 DIAGNOSIS — R399 Unspecified symptoms and signs involving the genitourinary system: Secondary | ICD-10-CM | POA: Diagnosis not present

## 2022-04-30 MED ORDER — CEPHALEXIN 500 MG PO CAPS: 500.0000 mg | ORAL_CAPSULE | Freq: Two times a day (BID) | ORAL | 0 refills | Status: DC

## 2022-04-30 NOTE — Progress Notes (Signed)
? ?Virtual Visit  Note ?Due to COVID-19 pandemic this visit was conducted virtually. This visit type was conducted due to national recommendations for restrictions regarding the COVID-19 Pandemic (e.g. social distancing, sheltering in place) in an effort to limit this patient's exposure and mitigate transmission in our community. All issues noted in this document were discussed and addressed.  A physical exam was not performed with this format. ? ?I connected with Julie Vasquez on 04/30/22 at 1:35 pm by telephone and verified that I am speaking with the correct person using two identifiers. Julie Vasquez is currently located at home and no one is currently with her during visit. The provider, Jannifer Rodney, FNP is located in their office at time of visit. ? ?I discussed the limitations, risks, security and privacy concerns of performing an evaluation and management service by telephone and the availability of in person appointments. I also discussed with the patient that there may be a patient responsible charge related to this service. The patient expressed understanding and agreed to proceed. ? ? ?Julie Vasquez, Julie Vasquez are scheduled for a virtual visit with your provider today.   ? ?Just as we do with appointments in the office, we must obtain your consent to participate.  Your consent will be active for this visit and any virtual visit you may have with one of our providers in the next 365 days.   ? ?If you have a MyChart account, I can also send a copy of this consent to you electronically.  All virtual visits are billed to your insurance company just like a traditional visit in the office.  As this is a virtual visit, video technology does not allow for your provider to perform a traditional examination.  This may limit your provider's ability to fully assess your condition.  If your provider identifies any concerns that need to be evaluated in person or the need to arrange testing such as labs, EKG, etc, we will make  arrangements to do so.   ? ?Although advances in technology are sophisticated, we cannot ensure that it will always work on either your end or our end.  If the connection with a video visit is poor, we may have to switch to a telephone visit.  With either a video or telephone visit, we are not always able to ensure that we have a secure connection.   I need to obtain your verbal consent now.   Are you willing to proceed with your visit today?  ? ?Julie Vasquez has provided verbal consent on 04/30/2022 for a virtual visit (video or telephone). ? ? ?Jannifer Rodney, FNP ?04/30/2022  1:37 PM ? ? ? ?History and Present Illness: ? ?Pt calls the office today with UTI symptoms that started a few days ago. She took a home UTI test that was positive for nitrates.  ?Dysuria  ?This is a new problem. The problem occurs every urination. The problem has been waxing and waning. The quality of the pain is described as burning. The pain is at a severity of 4/10. Associated symptoms include frequency, hesitancy, nausea and urgency. Pertinent negatives include no chills, discharge, flank pain or hematuria. She has tried increased fluids for the symptoms. The treatment provided mild relief.  ? ? ?Review of Systems  ?Constitutional:  Negative for chills.  ?Gastrointestinal:  Positive for nausea.  ?Genitourinary:  Positive for dysuria, frequency, hesitancy and urgency. Negative for flank pain and hematuria.  ? ? ?Observations/Objective: ?No SOB or distress noted  ? ?Assessment and Plan: ?  1. UTI symptoms ?Force fluids ?AZO over the counter X2 days ?RTO if symptoms worsen or do not improve  ?- cephALEXin (KEFLEX) 500 MG capsule; Take 1 capsule (500 mg total) by mouth 2 (two) times daily.  Dispense: 14 capsule; Refill: 0 ? ? ?  ?I discussed the assessment and treatment plan with the patient. The patient was provided an opportunity to ask questions and all were answered. The patient agreed with the plan and demonstrated an understanding of the  instructions. ?  ?The patient was advised to call back or seek an in-person evaluation if the symptoms worsen or if the condition fails to improve as anticipated. ? ?The above assessment and management plan was discussed with the patient. The patient verbalized understanding of and has agreed to the management plan. Patient is aware to call the clinic if symptoms persist or worsen. Patient is aware when to return to the clinic for a follow-up visit. Patient educated on when it is appropriate to go to the emergency department.  ? ?Time call ended:  1:41 pm  ? ?I provided 6 minutes of  non face-to-face time during this encounter. ? ? ? ?Jannifer Rodney, FNP ? ? ?

## 2022-04-30 NOTE — Patient Instructions (Signed)
Pregnancy and Urinary Tract Infection ? ?A urinary tract infection (UTI) is an infection of any part of the urinary tract. This includes the kidneys, the tubes that connect the kidneys to the bladder (ureters), the bladder, and the tube that carries urine out of the body (urethra). These organs make, store, and get rid of urine in the body. Your health care provider may use other names to describe the infection. An upper UTI affects the ureters and kidneys (pyelonephritis). A lower UTI affects the bladder (cystitis) and urethra (urethritis). ?Most UTIs are caused by bacteria in the genital area, around the entrance to the urinary tract. These bacteria grow and cause irritation and inflammation of the urinary tract. You are more likely to develop a UTI during pregnancy because: ?The physical and hormonal changes that your body goes through make it easier for bacteria to get into your urinary tract. ?Your growing baby puts pressure on your bladder and can affect urine flow. ?Pregnant women with diabetes are at an increased risk for developing a UTI. It is important to recognize and treat UTIs in pregnancy because they can cause serious complications for both you and your baby. ?How does this affect me? ?Symptoms of a UTI include: ?Needing to urinate right away (urgently) and often, even if urinating a small amount. ?Pain, burning, or having a hard time passing urine. ?Blood in the urine. ?Unusual, cloudy, and bad-smelling urine. ?Pain in the abdomen or lower back. ?Vaginal discharge. ?You may also have: ?Vomiting or a decreased appetite. ?Confusion. ?Irritability or tiredness. ?A fever. ?Diarrhea. ?A low level of red blood cells (anemia). ?The development of high blood pressure during pregnancy (preeclampsia). ?How does this affect my baby? ?An untreated UTI during pregnancy could lead to a kidney infection or an infection throughout the mother's body (systemic infection). This can cause health problems and affect the  baby. Possible complications of an untreated UTI include: ?Your baby being born before 37 weeks of pregnancy (premature). ?Your baby being born with a low birth weight. ?Your baby having a higher risk of having his or her skin or the white parts of the eyes turn yellow (jaundice). ?What can I do to lower my risk? ?To prevent a UTI: ?Do not hold urine for long periods of time. Empty your bladder as soon as you feel the urge. ?Always wipe from front to back, especially after a bowel movement. Use each tissue one time when you wipe. ?Empty your bladder after sex. ?Keep your genital area dry. ?Drink 6 to 8 glasses of water each day. ?Do not douche or use deodorant sprays. ?Wear cotton underwear and loose clothing. ?How is this treated? ?Treatment for this condition may include: ?Antibiotic medicines that are safe to take during pregnancy. ?Other medicines to treat less common causes of UTI. ?Follow these instructions at home: ?If you were prescribed an antibiotic medicine, take it as told by your health care provider. Do not stop using the antibiotic even if you start to feel better. ?Keep all follow-up visits. This is important. ?Contact a health care provider if: ?Your symptoms do not improve or they get worse. ?You have abnormal vaginal discharge. ?Get help right away if you: ?Have a fever. ?Have nausea and vomiting. ?Have back or side pain. ?Have lower belly pain, tightness, or feel contractions in your uterus. ?Have a gush of fluid from your vagina. ?Have blood in your urine. ?Summary ?A UTI is an infection of any part of the urinary tract, which includes the kidneys, ureters, bladder,   and urethra. ?Most urinary tract infections are caused by bacteria in your genital area, around the entrance to your urinary tract (urethra). ?You are more likely to develop a UTI during pregnancy. It is important to recognize and treat UTIs in pregnancy because of the risk of serious complications for both you and your baby. ?If you  were prescribed an antibiotic medicine, take it as told by your health care provider. Do not stop using the antibiotic even if you start to feel better. ?This information is not intended to replace advice given to you by your health care provider. Make sure you discuss any questions you have with your health care provider. ?Document Revised: 07/23/2021 Document Reviewed: 07/23/2021 ?Elsevier Patient Education ? 2023 Elsevier Inc. ? ?

## 2022-06-08 ENCOUNTER — Encounter: Payer: Self-pay | Admitting: Family Medicine

## 2022-06-08 ENCOUNTER — Encounter: Payer: 59 | Admitting: Family Medicine

## 2022-07-24 ENCOUNTER — Other Ambulatory Visit: Payer: 59

## 2022-07-26 ENCOUNTER — Other Ambulatory Visit: Payer: Self-pay | Admitting: Family Medicine

## 2022-07-26 DIAGNOSIS — E039 Hypothyroidism, unspecified: Secondary | ICD-10-CM

## 2022-07-27 ENCOUNTER — Other Ambulatory Visit: Payer: Self-pay | Admitting: *Deleted

## 2022-07-27 DIAGNOSIS — F411 Generalized anxiety disorder: Secondary | ICD-10-CM

## 2022-07-27 MED ORDER — HYDROXYZINE PAMOATE 25 MG PO CAPS: 25.0000 mg | ORAL_CAPSULE | Freq: Three times a day (TID) | ORAL | 0 refills | Status: DC | PRN

## 2022-09-12 ENCOUNTER — Other Ambulatory Visit: Payer: Self-pay | Admitting: Family Medicine

## 2022-09-12 DIAGNOSIS — F411 Generalized anxiety disorder: Secondary | ICD-10-CM

## 2022-09-15 ENCOUNTER — Other Ambulatory Visit: Payer: Self-pay | Admitting: Family Medicine

## 2022-09-15 DIAGNOSIS — F411 Generalized anxiety disorder: Secondary | ICD-10-CM

## 2022-09-16 ENCOUNTER — Encounter: Payer: Self-pay | Admitting: Family Medicine

## 2022-09-16 NOTE — Telephone Encounter (Signed)
Letter sent.

## 2022-09-16 NOTE — Telephone Encounter (Signed)
[name] NTBS 30 days given 07/27/22

## 2022-10-07 ENCOUNTER — Telehealth (INDEPENDENT_AMBULATORY_CARE_PROVIDER_SITE_OTHER): Payer: 59 | Admitting: Family Medicine

## 2022-10-07 NOTE — Progress Notes (Signed)
Attempted to call patient, did not answer, instructed patient via voicemail to call back tomorrow.

## 2022-10-09 ENCOUNTER — Telehealth (INDEPENDENT_AMBULATORY_CARE_PROVIDER_SITE_OTHER): Payer: 59 | Admitting: Family Medicine

## 2022-10-09 ENCOUNTER — Encounter: Payer: Self-pay | Admitting: Family Medicine

## 2022-10-09 DIAGNOSIS — K3184 Gastroparesis: Secondary | ICD-10-CM | POA: Diagnosis not present

## 2022-10-09 DIAGNOSIS — F411 Generalized anxiety disorder: Secondary | ICD-10-CM

## 2022-10-09 MED ORDER — FLUOXETINE HCL 20 MG PO CAPS: 20.0000 mg | ORAL_CAPSULE | Freq: Every day | ORAL | 1 refills | Status: DC

## 2022-10-09 MED ORDER — METOCLOPRAMIDE HCL 10 MG PO TABS: 10.0000 mg | ORAL_TABLET | Freq: Three times a day (TID) | ORAL | 3 refills | Status: DC

## 2022-10-09 NOTE — Progress Notes (Signed)
Virtual Visit via mychart video Note  I connected with Julie Vasquez on 10/09/22 at 0906 by video and verified that I am speaking with the correct person using two identifiers. Julie Vasquez is currently located at school and patient are currently with her during visit. The provider, Fransisca Kaufmann Namrata Dangler, MD is located in their office at time of visit.  Call ended at [phone]  I discussed the limitations, risks, security and privacy concerns of performing an evaluation and management service by telephone and the availability of in person appointments. I also discussed with the patient that there may be a patient responsible charge related to this service. The patient expressed understanding and agreed to proceed.   History and Present Illness: She had another gastric emptying study that is normal,  she has chronic hunger.  She feels like she constantly needing to eat.  She vomits still multiple times per week.  She has it about 3 times per week.  She was in a research study for gastroparesis.  She has a normal study.  Her GI has not responded yet.  She still has some constipation but she takes some miralax and that helps. She saw an allergist and there was no diagnosible allergy. She saw endocrinologist and did not find a cause, she was mildly hypothyroid. She gets belching and burping after eating.  Patient is also coming in to discuss anxiety and stress.  She needs a refill on her medicine.  She has been slightly more stressed with her stomach stuff increasing recently and feels like she is not getting some good answers from GI because its been a harder case.  She is going to go back and see her counselor.  1. Gastroparesis   2. Generalized anxiety disorder     Outpatient Encounter Medications as of 10/09/2022  Medication Sig   amitriptyline (ELAVIL) 10 MG tablet Take 20 mg by mouth at bedtime.   metoCLOPramide (REGLAN) 10 MG tablet Take 1 tablet (10 mg total) by mouth 3 (three) times daily before  meals.   ondansetron (ZOFRAN-ODT) 4 MG disintegrating tablet Take 4 mg by mouth every 8 (eight) hours as needed.   cetirizine (ZYRTEC) 10 MG tablet Take 10 mg by mouth daily.   EPINEPHrine 0.3 mg/0.3 mL IJ SOAJ injection Inject 0.3 mg into the muscle as needed for anaphylaxis.   FLUoxetine (PROZAC) 20 MG capsule Take 1 capsule (20 mg total) by mouth daily.   hydrOXYzine (VISTARIL) 25 MG capsule Take 1 capsule (25 mg total) by mouth every 8 (eight) hours as needed. (NEEDS TO BE SEEN BEFORE NEXT REFILL)   levothyroxine (SYNTHROID) 25 MCG tablet Take 1 tablet (25 mcg total) by mouth daily before breakfast. (Needs labwork)   norethindrone-ethinyl estradiol-iron (LOESTRIN FE) 1.5-30 MG-MCG tablet Take 1 tablet by mouth daily.   [DISCONTINUED] cephALEXin (KEFLEX) 500 MG capsule Take 1 capsule (500 mg total) by mouth 2 (two) times daily.   [DISCONTINUED] esomeprazole (NEXIUM) 40 MG capsule Take 1 capsule by mouth 2 (two) times daily.   [DISCONTINUED] FLUoxetine (PROZAC) 20 MG capsule Take 1 capsule (20 mg total) by mouth daily. (NEEDS TO BE SEEN BEFORE NEXT REFILL)   [DISCONTINUED] scopolamine (TRANSDERM-SCOP) 1 MG/3DAYS Place 1 patch onto the skin every 3 (three) days.   No facility-administered encounter medications on file as of 10/09/2022.    Review of Systems  Constitutional:  Negative for chills and fever.  Eyes:  Negative for visual disturbance.  Respiratory:  Negative for chest tightness and shortness of  breath.   Cardiovascular:  Negative for chest pain and leg swelling.  Gastrointestinal:  Positive for constipation, nausea and vomiting. Negative for abdominal pain.  Musculoskeletal:  Negative for back pain and gait problem.  Skin:  Negative for rash.  Neurological:  Negative for light-headedness and headaches.  Psychiatric/Behavioral:  Negative for agitation and behavioral problems.   All other systems reviewed and are negative.   Observations/Objective: Patient sounds comfortable  and in no acute distress  Assessment and Plan: Problem List Items Addressed This Visit       Other   Generalized anxiety disorder   Relevant Medications   amitriptyline (ELAVIL) 10 MG tablet   FLUoxetine (PROZAC) 20 MG capsule   Other Visit Diagnoses     Gastroparesis    -  Primary   Relevant Medications   metoCLOPramide (REGLAN) 10 MG tablet       We will add Reglan and see how it does for her.  Continue Prozac and amitriptyline on her current dose. Follow up plan: Return in about 3 months (around 01/09/2023), or if symptoms worsen or fail to improve, for gastroparesis.     I discussed the assessment and treatment plan with the patient. The patient was provided an opportunity to ask questions and all were answered. The patient agreed with the plan and demonstrated an understanding of the instructions.   The patient was advised to call back or seek an in-person evaluation if the symptoms worsen or if the condition fails to improve as anticipated.  The above assessment and management plan was discussed with the patient. The patient verbalized understanding of and has agreed to the management plan. Patient is aware to call the clinic if symptoms persist or worsen. Patient is aware when to return to the clinic for a follow-up visit. Patient educated on when it is appropriate to go to the emergency department.    I provided 17 minutes of non-face-to-face time during this encounter.    Nils Pyle, MD

## 2022-10-26 ENCOUNTER — Other Ambulatory Visit: Payer: Self-pay | Admitting: Family Medicine

## 2022-10-26 DIAGNOSIS — E039 Hypothyroidism, unspecified: Secondary | ICD-10-CM

## 2022-10-26 NOTE — Telephone Encounter (Signed)
Julie Vasquez Needs labwork drawn

## 2022-10-27 NOTE — Telephone Encounter (Signed)
Patient aware to come and do labs.

## 2022-10-29 MED ORDER — LEVOTHYROXINE SODIUM 25 MCG PO TABS: 25.0000 ug | ORAL_TABLET | Freq: Every day | ORAL | 0 refills | Status: AC

## 2022-10-29 NOTE — Telephone Encounter (Signed)
30 d RF sent to pharmacy

## 2022-10-29 NOTE — Addendum Note (Signed)
Addended by: Julious Payer D on: 10/29/2022 01:58 PM   Modules accepted: Orders

## 2022-11-23 ENCOUNTER — Other Ambulatory Visit: Payer: Self-pay | Admitting: Family Medicine

## 2022-11-23 DIAGNOSIS — E039 Hypothyroidism, unspecified: Secondary | ICD-10-CM

## 2023-03-11 ENCOUNTER — Telehealth: Payer: Self-pay

## 2023-03-11 NOTE — Transitions of Care (Post Inpatient/ED Visit) (Signed)
   03/11/2023  Name: Summerlyn Babauta MRN: [id number] DOB: 11-Dec-2001  Today's TOC FU Call Status: Today's TOC FU Call Status:: Unsuccessul Call (1st Attempt) Unsuccessful Call (1st Attempt) Date: 03/11/23  Attempted to reach the patient regarding the most recent Inpatient/ED visit.  Follow Up Plan: Additional outreach attempts will be made to reach the patient to complete the Transitions of Care (Post Inpatient/ED visit) call.   Signature Juanda Crumble, [place] Direct Dial [phone]

## 2023-03-12 NOTE — Transitions of Care (Post Inpatient/ED Visit) (Unsigned)
   03/12/2023  Name: Julie Vasquez MRN: [id number] DOB: January 01, 2001  Today's TOC FU Call Status: Today's TOC FU Call Status:: Unsuccessful Call (2nd Attempt) Unsuccessful Call (1st Attempt) Date: 03/11/23 Unsuccessful Call (2nd Attempt) Date: 03/12/23  Attempted to reach the patient regarding the most recent Inpatient/ED visit.  Follow Up Plan: Additional outreach attempts will be made to reach the patient to complete the Transitions of Care (Post Inpatient/ED visit) call.   Signature Juanda Crumble, [place] Direct Dial [phone]

## 2023-03-15 NOTE — Transitions of Care (Post Inpatient/ED Visit) (Signed)
   03/15/2023  Name: [name] MRN: [id number] DOB: 11/18/2001  Today's TOC FU Call Status: Today's TOC FU Call Status:: Unsuccessful Call (3rd Attempt) Unsuccessful Call (1st Attempt) Date: 03/11/23 Unsuccessful Call (2nd Attempt) Date: 03/12/23 Unsuccessful Call (3rd Attempt) Date: 03/15/23  Attempted to reach the patient regarding the most recent Inpatient/ED visit.  Follow Up Plan: No further outreach attempts will be made at this time. We have been unable to contact the patient.  Signature Juanda Crumble, [place] Direct Dial [phone]

## 2023-04-03 ENCOUNTER — Other Ambulatory Visit: Payer: Self-pay | Admitting: Family Medicine

## 2023-04-03 DIAGNOSIS — F411 Generalized anxiety disorder: Secondary | ICD-10-CM

## 2023-05-10 ENCOUNTER — Other Ambulatory Visit: Payer: Self-pay | Admitting: Family Medicine

## 2023-05-10 ENCOUNTER — Encounter: Payer: Self-pay | Admitting: Family Medicine

## 2023-05-10 DIAGNOSIS — F411 Generalized anxiety disorder: Secondary | ICD-10-CM

## 2023-05-10 NOTE — Telephone Encounter (Signed)
[name] pt NTBS 30-d given 04/05/23 

## 2023-05-10 NOTE — Telephone Encounter (Signed)
LEFT MESSAGE FOR PT TO CALL BACK AND SCHEDULE APPOINTMENT AND WILL MAIL LETTER

## 2023-05-14 ENCOUNTER — Other Ambulatory Visit: Payer: Self-pay | Admitting: Family Medicine

## 2023-05-14 DIAGNOSIS — F411 Generalized anxiety disorder: Secondary | ICD-10-CM

## 2023-05-21 IMAGING — NM NM HEPATO W/GB/PHARM/[PERSON_NAME]
2 series · 12 of 12 positions shown · non-contrast
Comparison: None.

CLINICAL DATA: Abdominal pain with nausea vomiting

EXAM:
NUCLEAR MEDICINE HEPATOBILIARY IMAGING WITH GALLBLADDER EF
TECHNIQUE: Sequential images of the abdomen were obtained [DATE] minutes
following intravenous administration of radiopharmaceutical. After
slow intravenous infusion of 1.11 micrograms Cholecystokinin,
gallbladder ejection fraction was determined.
RADIOPHARMACEUTICALS:  5.5 mCi Ic-OOm Choletec IV

[Series 1: biliary · 3.25mm/px · 6 of 60 frames shown]
[frame 6/60]
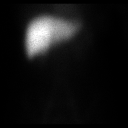
[frame 16/60]
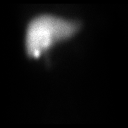
[frame 26/60]
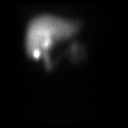
[frame 36/60]
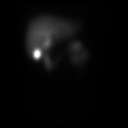
[frame 46/60]
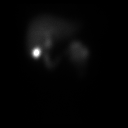
[frame 56/60]
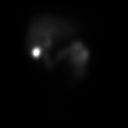

[Series 2: gbef · 3.25mm/px · 6 of 60 frames shown]
[frame 6/60]
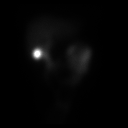
[frame 16/60]
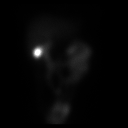
[frame 26/60]
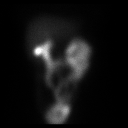
[frame 36/60]
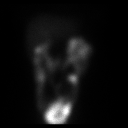
[frame 46/60]
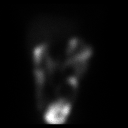
[frame 56/60]
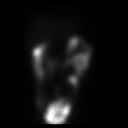

[12 of 12 positions shown; findings below may reference images not displayed]

FINDINGS: Prompt uptake and biliary excretion of activity by the liver is
seen. Gallbladder activity is visualized, consistent with patency of
cystic duct. Biliary activity passes into small bowel, consistent
with patent common bile duct.

Calculated gallbladder ejection fraction is 97%. (At 60 min, normal
ejection fraction is greater than 40%.)
IMPRESSION: Negative examination

## 2024-11-21 ENCOUNTER — Encounter: Payer: Self-pay | Admitting: Family

## 2024-11-21 ENCOUNTER — Ambulatory Visit: Admitting: Family

## 2024-11-21 VITALS — BP 99/67 | HR 81 | Temp 96.0°F | Ht 62.0 in | Wt 107.0 lb

## 2024-11-21 DIAGNOSIS — H6690 Otitis media, unspecified, unspecified ear: Secondary | ICD-10-CM

## 2024-11-21 DIAGNOSIS — J019 Acute sinusitis, unspecified: Secondary | ICD-10-CM

## 2024-11-21 DIAGNOSIS — R062 Wheezing: Secondary | ICD-10-CM

## 2024-11-21 DIAGNOSIS — K3184 Gastroparesis: Secondary | ICD-10-CM

## 2024-11-21 MED ORDER — AMOXICILLIN-POT CLAVULANATE 600-42.9 MG/5ML PO SUSR: 90.0000 mg/kg/d | Freq: Two times a day (BID) | ORAL | 0 refills | Status: AC

## 2024-11-21 MED ORDER — ONDANSETRON HCL 4 MG PO TABS: 4.0000 mg | ORAL_TABLET | Freq: Three times a day (TID) | ORAL | 0 refills | Status: AC | PRN

## 2024-11-21 MED ORDER — ALBUTEROL SULFATE (2.5 MG/3ML) 0.083% IN NEBU: 2.5000 mg | INHALATION_SOLUTION | Freq: Four times a day (QID) | RESPIRATORY_TRACT | 1 refills | Status: AC | PRN

## 2024-11-21 MED ORDER — PREDNISONE 10 MG (21) PO TBPK: ORAL_TABLET | ORAL | 0 refills | Status: AC

## 2024-11-21 NOTE — Progress Notes (Signed)
 Subjective:    Patient ID: Julie Vasquez, female    DOB: [birth year], 23 y.o.   MRN: [id number]  Chief Complaint  Patient presents with   Cough   ears stopped up   Bronchitis    ER 2 weeks  covid flu test have been negitive    Fever   PT presents to the office today with cough for 10/15/24. She reports she was feeling better, but now feels worse. She was seen at the Urgent Care and was given cough medication, flonase , and albuterol. However, over the last 4 days she has started feeling worse.  Cough Associated symptoms include ear pain, a fever, headaches and a sore throat.  Fever  Associated symptoms include congestion, coughing, ear pain, headaches and a sore throat.  Sinus Problem This is a new problem. The current episode started 1 to 4 weeks ago. The problem has been gradually worsening since onset. The maximum temperature recorded prior to her arrival was 100.4 - 100.9 F. Her pain is at a severity of 3/10. The pain is mild. Associated symptoms include congestion, coughing, ear pain, headaches, sinus pressure, sneezing, a sore throat and swollen glands (left). Past treatments include acetaminophen. The treatment provided mild relief.      Review of Systems  Constitutional:  Positive for fever.  HENT:  Positive for congestion, ear pain, sinus pressure, sneezing and sore throat.   Respiratory:  Positive for cough.   Neurological:  Positive for headaches.  All other systems reviewed and are negative.   Social History   Socioeconomic History   Marital status: Single    Spouse name: Not on file   Number of children: Not on file   Years of education: Not on file   Highest education level: Not on file  Occupational History   Not on file  Tobacco Use   Smoking status: Never   Smokeless tobacco: Never  Substance and Sexual Activity   Alcohol use: No    Alcohol/week: 0.0 standard drinks of alcohol   Drug use: No   Sexual activity: Not on file  Other Topics Concern   Not on  file  Social History Narrative   Not on file   Social Drivers of Health   Financial Resource Strain: Not on file  Food Insecurity: No Food Insecurity (05/31/2024)   Received from [facility]   Hunger Vital Sign    Within the past 12 months, you worried that your food would run out before you got the money to buy more.: Never true    Within the past 12 months, the food you bought just didn't last and you didn't have money to get more.: Never true  Transportation Needs: No Transportation Needs (05/31/2024)   Received from [facility]   PRAPARE - Transportation    Lack of Transportation (Medical): No    Lack of Transportation (Non-Medical): No  Physical Activity: Not on file  Stress: Not on file  Social Connections: Unknown (04/28/2022)   [facility]   Social Network    Social Network: Not on file   Family History  Problem Relation Age of Onset   Allergic rhinitis Father    Allergic rhinitis Sister    Angioedema Neg Hx    Asthma Neg Hx    Eczema Neg Hx    Urticaria Neg Hx         Objective:   Physical Exam Vitals reviewed.  Constitutional:      General: She is not  in acute distress.    Appearance: She is well-developed.  HENT:     Head: Normocephalic and atraumatic.     Right Ear: Tympanic membrane is erythematous.     Left Ear: Tenderness present. A middle ear effusion is present. Tympanic membrane is erythematous.  Eyes:     Pupils: Pupils are equal, round, and reactive to light.  Neck:     Thyroid: No thyromegaly.  Cardiovascular:     Rate and Rhythm: Normal rate and regular rhythm.     Heart sounds: Normal heart sounds. No murmur heard. Pulmonary:     Effort: Pulmonary effort is normal. No respiratory distress.     Breath sounds: No stridor. Wheezing present.  Abdominal:     General: Bowel sounds are normal. There is no distension.     Palpations: Abdomen is soft.     Tenderness: There is no abdominal tenderness.  Musculoskeletal:         General: No tenderness. Normal range of motion.     Cervical back: Normal range of motion and neck supple.  Skin:    General: Skin is warm and dry.  Neurological:     Mental Status: She is alert and oriented to person, place, and time.     Cranial Nerves: No cranial nerve deficit.     Deep Tendon Reflexes: Reflexes are normal and symmetric.  Psychiatric:        Behavior: Behavior normal.        Thought Content: Thought content normal.        Judgment: Judgment normal.       BP 99/67   Pulse 81   Temp (!) 96 F (35.6 C) (Temporal)   Ht 5' 2 (1.575 m)   Wt 107 lb (48.5 kg)   SpO2 97%   BMI 19.57 kg/m      Assessment & Plan:  Julie Vasquez comes in today with chief complaint of Cough, ears stopped up, Bronchitis (ER 2 weeks  covid flu test have been negitive ), and Fever   Diagnosis and orders addressed:  1. Acute non-recurrent sinusitis, unspecified location (Primary) - Take meds as prescribed - Use a cool mist humidifier  -Use saline nose sprays frequently -Force fluids -For any cough or congestion  Use plain Mucinex- regular strength or max strength is fine -For fever or aces or pains- take tylenol or ibuprofen. -Follow up if symptoms worsen or do not improve  - amoxicillin-clavulanate (AUGMENTIN ES-600) 600-42.9 MG/5ML suspension; Take 18.2 mLs (2,184 mg total) by mouth 2 (two) times daily for 7 days.  Dispense: 254.8 mL; Refill: 0 - predniSONE (STERAPRED UNI-PAK 21 TAB) 10 MG (21) TBPK tablet; Use as directed  Dispense: 21 tablet; Refill: 0  2. Acute otitis media, unspecified otitis media type - amoxicillin-clavulanate (AUGMENTIN ES-600) 600-42.9 MG/5ML suspension; Take 18.2 mLs (2,184 mg total) by mouth 2 (two) times daily for 7 days.  Dispense: 254.8 mL; Refill: 0 - predniSONE (STERAPRED UNI-PAK 21 TAB) 10 MG (21) TBPK tablet; Use as directed  Dispense: 21 tablet; Refill: 0  3. Gastroparesis Take the zofran 30 mins prior to antibiotic  - ondansetron (ZOFRAN) 4  MG tablet; Take 1 tablet (4 mg total) by mouth every 8 (eight) hours as needed for nausea or vomiting.  Dispense: 20 tablet; Refill: 0  4. Wheezing - albuterol (PROVENTIL) (2.5 MG/3ML) 0.083% nebulizer solution; Take 3 mLs (2.5 mg total) by nebulization every 6 (six) hours as needed for wheezing or shortness of breath.  Dispense: 150  mL; Refill: 1 - For home use only DME Nebulizer machine - predniSONE (STERAPRED UNI-PAK 21 TAB) 10 MG (21) TBPK tablet; Use as directed  Dispense: 21 tablet; Refill: 0       Bari Learn, FNP

## 2024-11-21 NOTE — Patient Instructions (Signed)
Bronchospasm, Adult  Bronchospasm is a tightening of the smooth muscle that wraps around the small airways in the lungs. When the muscle tightens, the small airways narrow. Narrowed airways limit the air you breathe in or out of your lungs. Inflammation (swelling) and more mucus (sputum) than usual can further irritate the airways. This can make it very hard to breathe. Bronchospasm can happen suddenly or over a period of time. What are the causes? Common causes of this condition include: An infection, such as a cold or sinus drainage. Exercise. Strong odors from aerosol sprays, and fumes from perfume, candles, and household cleaners. Cold air. Stress or strong emotions such as crying or laughing. What increases the risk? The following factors may make you more likely to develop this condition: Having asthma. Smoking or being around someone who smokes (secondhand smoke). Seasonal allergies, such as pollen or mold. Allergic reaction (anaphylaxis) to food, medicine, or insect bites or stings. What are the signs or symptoms? Symptoms of this condition include: Making a high-pitched whistling sound when you breathe, most often when you breathe out (wheezing). Coughing. Chest tightness. Shortness of breath. Decreased ability to exercise. Noisy breathing or a high-pitched cough. How is this diagnosed? This condition may be diagnosed based on your medical history and a physical exam. Your health care provider may also perform tests, including: A chest X-ray. Lung function tests. How is this treated? This condition may be treated by: Using inhaled medicines. These open up (relax) the airways and help you breathe. They can be taken with a metered dose inhaler or a nebulizer device. Taking corticosteroid medicines. These may be given to reduce inflammation and swelling. Removing the irritant or trigger that started the bronchospasm. Follow these instructions at home: Medicines Take  over-the-counter and prescription medicines only as told by your health care provider. If you need to use an inhaler or nebulizer to take your medicine, ask your health care provider how to use it correctly. You may be given a spacer to use with your inhaler. This makes it easier to get the medicine from the inhaler into your lungs. Lifestyle Do not use any products that contain nicotine or tobacco. These products include cigarettes, chewing tobacco, and vaping devices, such as e-cigarettes. If you need help quitting, ask your health care provider. Keep track of things that trigger your bronchospasm. Avoid these if possible. When pollen, air pollution, or humidity levels are bad, keep windows closed and use an air conditioner or go to places that have air conditioning. Find ways to manage stress and your emotions, such as mindfulness, relaxation, or breathing exercises. Activity Some people have bronchospasm when they exercise. This is called exercise-induced bronchoconstriction (EIB). If you have this problem, talk with your health care provider about how to manage EIB. Some tips include: Using your fast-acting inhaler before exercise. Exercising indoors if it is very cold or humid, or if the pollen and mold counts are high. Warming up and cool down before and after exercise. Stopping exercising right away if your symptoms start or get worse. General instructions If you have asthma, make sure you have an asthma action plan. Stay up to date on your immunizations. Keep all follow-up visits. This is important. Get help right away if: You have trouble breathing. Your wheezing and coughing do not get better after taking your medicine. You have chest pain. You have trouble speaking more than one-word sentences. These symptoms may be an emergency. Get help right away. Call 911. Do not wait to  see if the symptoms will go away. Do not drive yourself to the hospital. Summary Bronchospasm is a  tightening of the smooth muscle that wraps around the small airways in the lungs. Some people have bronchospasm when they exercise. This is called exercise-induced bronchoconstriction (EIB). If you have this problem, talk with your health care provider about how to manage EIB. Do not use any products that contain nicotine or tobacco. These products include cigarettes, chewing tobacco, and vaping devices, such as e-cigarettes. If you need help quitting, ask your health care provider. Get help right away if your wheezing and coughing do not get better after taking your medicine. This information is not intended to replace advice given to you by your health care provider. Make sure you discuss any questions you have with your health care provider. Document Revised: 06/30/2021 Document Reviewed: 06/30/2021 Elsevier Patient Education  2024 [employer].

## 2024-12-22 ENCOUNTER — Encounter: Payer: Self-pay | Admitting: Family Medicine

## 2024-12-22 ENCOUNTER — Ambulatory Visit (INDEPENDENT_AMBULATORY_CARE_PROVIDER_SITE_OTHER): Admitting: Family Medicine

## 2024-12-22 VITALS — BP 108/80 | HR 104 | Temp 98.1°F | Ht 62.0 in | Wt 105.0 lb

## 2024-12-22 DIAGNOSIS — J0100 Acute maxillary sinusitis, unspecified: Secondary | ICD-10-CM | POA: Diagnosis not present

## 2024-12-22 MED ORDER — FLUTICASONE PROPIONATE 50 MCG/ACT NA SUSP: 1.0000 | Freq: Two times a day (BID) | NASAL | 6 refills | Status: AC | PRN

## 2024-12-22 MED ORDER — CEFDINIR 300 MG PO CAPS: 300.0000 mg | ORAL_CAPSULE | Freq: Two times a day (BID) | ORAL | 0 refills | Status: DC

## 2024-12-22 NOTE — Progress Notes (Signed)
 "  BP 108/80   Pulse (!) 104   Temp 98.1 F (36.7 C)   Ht 5' 2 (1.575 m)   Wt 105 lb (47.6 kg)   SpO2 98%   BMI 19.20 kg/m    Subjective:   Patient ID: Julie Vasquez, female    DOB: [birth year], 24 y.o.   MRN: [id number]  HPI: Julie Vasquez is a 24 y.o. female presenting on 12/22/2024 for URI (Present since Oct)   Discussed the use of AI scribe software for clinical note transcription with the patient, who gave verbal consent to proceed.  History of Present Illness   Julie Vasquez is a 24 year old female who presents with persistent cough and recurrent fever.  Cough and upper respiratory symptoms - Persistent cough since October, initially improved but worsened after Thanksgiving - Cough accompanied by post-nasal drip - Sinus pressure under the eyes, worsens when nose is clear - Stuffy ears with occasional popping - History of ear infections and sinus issues - Nebulizer therapy provides some relief for breathing difficulties - Current use of Zyrtec  Fever and systemic symptoms - Fever returned on December 30th, with a temperature of 100.38F this morning and previous high of 101F - Fever above 101F associated with severe illness, shaking, and dry heaving - Fever initially subsided after antibiotics but recurred  Gastrointestinal and appetite changes - Vomiting occurred with fever - Burning sensation during urination, but urinary tract infection previously ruled out - Diet limited to ramen noodles and Goldfish crackers due to poor appetite - No recent bowel movement  Prior treatments and medical encounters - Visited urgent care in November and completed a course of amoxicillin  in early December - Treated with antibiotics, steroids, and nebulizer therapy without resolution of cough - Unable to schedule follow-up for pelvic pain due to use of PTO for illness          Relevant past medical, surgical, family and social history reviewed and updated as indicated. Interim medical  history since our last visit reviewed. Allergies and medications reviewed and updated.  Review of Systems  Constitutional:  Negative for chills and fever.  HENT:  Positive for congestion, postnasal drip, rhinorrhea, sinus pressure, sneezing and sore throat. Negative for ear discharge and ear pain.   Eyes:  Negative for pain, redness and visual disturbance.  Respiratory:  Positive for cough and wheezing. Negative for chest tightness.   Cardiovascular:  Negative for chest pain and leg swelling.  Genitourinary:  Negative for difficulty urinating and dysuria.  Musculoskeletal:  Negative for back pain and gait problem.  Skin:  Negative for rash.  Neurological:  Negative for light-headedness and headaches.  Psychiatric/Behavioral:  Negative for agitation and behavioral problems.   All other systems reviewed and are negative.   Per HPI unless specifically indicated above   Allergies as of 12/22/2024       Reactions   Latex Itching, Other (See Comments)   Other Itching   Peanuts        Medication List        Accurate as of December 22, 2024 10:49 AM. If you have any questions, ask your nurse or doctor.          albuterol  (2.5 MG/3ML) 0.083% nebulizer solution Commonly known as: PROVENTIL  Take 3 mLs (2.5 mg total) by nebulization every 6 (six) hours as needed for wheezing or shortness of breath.   cefdinir 300 MG capsule Commonly known as: OMNICEF Take 1 capsule (300 mg total) by mouth 2 (two)  times daily. 1 po BID Started by: Fonda Levins, MD   EPINEPHrine  0.3 mg/0.3 mL Soaj injection Commonly known as: EPI-PEN Inject 0.3 mg into the muscle as needed for anaphylaxis.   fluticasone  50 MCG/ACT nasal spray Commonly known as: FLONASE  Place 1 spray into both nostrils 2 (two) times daily as needed for allergies or rhinitis. Started by: Fonda Levins, MD   levothyroxine  25 MCG tablet Commonly known as: SYNTHROID  Take 1 tablet (25 mcg total) by mouth daily before  breakfast. (Needs labwork)   norethindrone-ethinyl estradiol-iron 1.5-30 MG-MCG tablet Commonly known as: LOESTRIN FE Take 1 tablet by mouth daily.   ondansetron  4 MG disintegrating tablet Commonly known as: ZOFRAN -ODT Take 4 mg by mouth every 8 (eight) hours as needed.   ondansetron  4 MG tablet Commonly known as: Zofran  Take 1 tablet (4 mg total) by mouth every 8 (eight) hours as needed for nausea or vomiting.   predniSONE  10 MG (21) Tbpk tablet Commonly known as: STERAPRED UNI-PAK 21 TAB Use as directed         Objective:   BP 108/80   Pulse (!) 104   Temp 98.1 F (36.7 C)   Ht 5' 2 (1.575 m)   Wt 105 lb (47.6 kg)   SpO2 98%   BMI 19.20 kg/m   Wt Readings from Last 3 Encounters:  12/22/24 105 lb (47.6 kg)  11/21/24 107 lb (48.5 kg)  03/05/22 125 lb (56.7 kg)    Physical Exam Physical Exam   HEENT: Throat sore, lower throat less sore, painful swallowing. No ear infection, ear pressure present. Sinus pressure present. CHEST: Lungs clear to auscultation bilaterally. CARDIOVASCULAR: Heart regular rate and rhythm, no murmurs.         Assessment & Plan:   Problem List Items Addressed This Visit   None Visit Diagnoses       Acute non-recurrent maxillary sinusitis    -  Primary   Relevant Medications   cefdinir (OMNICEF) 300 MG capsule   fluticasone  (FLONASE ) 50 MCG/ACT nasal spray          Acute sinusitis Symptoms indicate a new sinus infection. Previous amoxicillin  treatment was ineffective. Possible compromised immune system causing recurrent infections. - Prescribed cefdinir twice daily. - Recommended probiotic or Greek yogurt twice daily. - Continue nebulizer as needed. - Prescribed Flonase  for nasal drainage. - Advised Benadryl at night. - Continue Zyrtec as usual.          Follow up plan: Return if symptoms worsen or fail to improve.  Counseling provided for all of the vaccine components No orders of the defined types were placed in  this encounter.   Fonda Levins, MD [facility] 12/22/2024, 10:49 AM     "

## 2025-01-22 ENCOUNTER — Telehealth: Admitting: Family Medicine

## 2025-01-22 ENCOUNTER — Encounter: Payer: Self-pay | Admitting: Family Medicine

## 2025-01-22 DIAGNOSIS — E538 Deficiency of other specified B group vitamins: Secondary | ICD-10-CM

## 2025-01-22 DIAGNOSIS — F411 Generalized anxiety disorder: Secondary | ICD-10-CM

## 2025-01-22 DIAGNOSIS — E079 Disorder of thyroid, unspecified: Secondary | ICD-10-CM

## 2025-01-22 MED ORDER — FLUOXETINE HCL 20 MG PO CAPS: 20.0000 mg | ORAL_CAPSULE | Freq: Every day | ORAL | 2 refills | Status: AC

## 2025-02-19 ENCOUNTER — Ambulatory Visit: Admitting: Family Medicine
